# Patient Record
Sex: Male | Born: 2000
Health system: Southern US, Community
[De-identification: ages and names within clinical notes are randomized; demographics above are authoritative.]

## PROBLEM LIST (undated history)

## (undated) DIAGNOSIS — F902 Attention-deficit hyperactivity disorder, combined type: Secondary | ICD-10-CM

## (undated) DIAGNOSIS — F952 Tourette's disorder: Secondary | ICD-10-CM

## (undated) DIAGNOSIS — F988 Other specified behavioral and emotional disorders with onset usually occurring in childhood and adolescence: Secondary | ICD-10-CM

## (undated) DIAGNOSIS — F419 Anxiety disorder, unspecified: Secondary | ICD-10-CM

## (undated) DIAGNOSIS — R55 Syncope and collapse: Secondary | ICD-10-CM

## (undated) DIAGNOSIS — F411 Generalized anxiety disorder: Secondary | ICD-10-CM

## (undated) DIAGNOSIS — F32A Depression, unspecified: Secondary | ICD-10-CM

## (undated) DIAGNOSIS — G259 Extrapyramidal and movement disorder, unspecified: Secondary | ICD-10-CM

## (undated) DIAGNOSIS — F329 Major depressive disorder, single episode, unspecified: Secondary | ICD-10-CM

## (undated) HISTORY — DX: Depression, unspecified: F32.A

## (undated) HISTORY — DX: Attention-deficit hyperactivity disorder, combined type: F90.2

## (undated) HISTORY — DX: Syncope and collapse: R55

## (undated) HISTORY — DX: Major depressive disorder, single episode, unspecified: F32.9

## (undated) HISTORY — DX: Extrapyramidal and movement disorder, unspecified: G25.9

## (undated) HISTORY — DX: Generalized anxiety disorder: F41.1

## (undated) HISTORY — DX: Other specified behavioral and emotional disorders with onset usually occurring in childhood and adolescence: F98.8

## (undated) HISTORY — PX: CIRCUMCISION: SUR203

---
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
## 2022-06-21 IMAGING — CT CT ABD-PELV W/ CM
2 of 4 series · 16 of 46 positions shown, 18 images · IV contrast (omnipaque)
Comparison: Ultrasound 10/20/2006

CLINICAL DATA: Right lower quadrant pain

EXAM:
CT ABDOMEN AND PELVIS WITH CONTRAST
TECHNIQUE: Multidetector CT imaging of the abdomen and pelvis was performed
using the standard protocol following bolus administration of
intravenous contrast.
CONTRAST:  100mL OMNIPAQUE IOHEXOL 300 MG/ML  SOLN

[Series 2: axial st · axial · 0.68mm/px · z∈[-462,-42]mm · 13 of 96 slices shown, 15 images]
[im 6/96  soft-tissue]
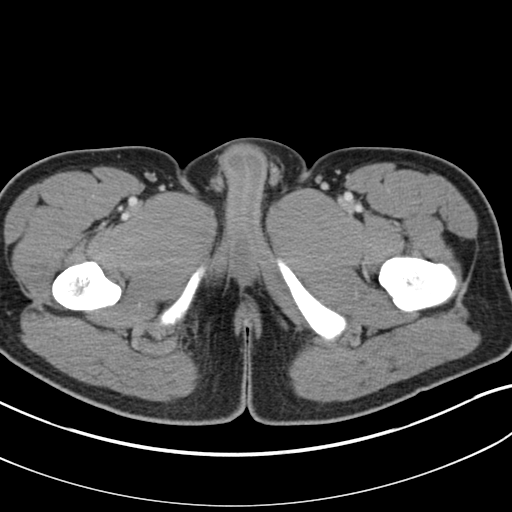
[im 6/96  bone]
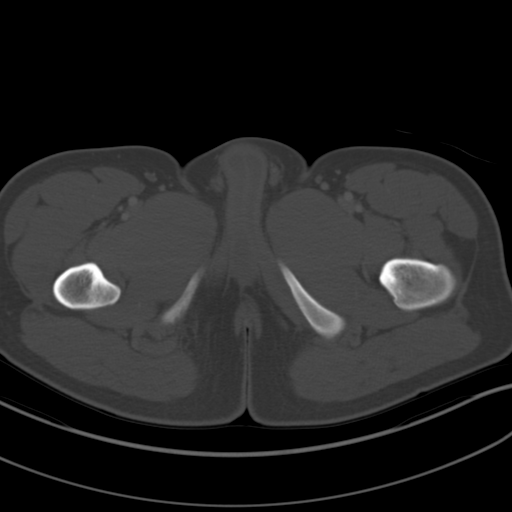
[im 11/96  soft-tissue]
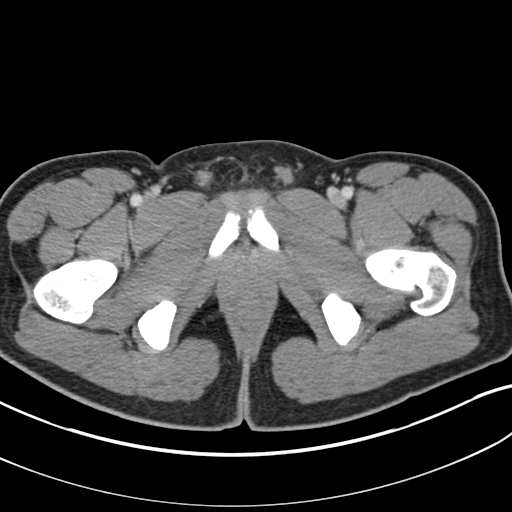
[im 22/96  soft-tissue]
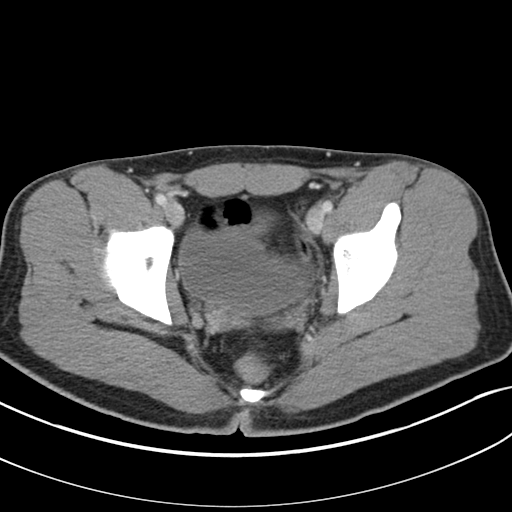
[im 27/96  soft-tissue]
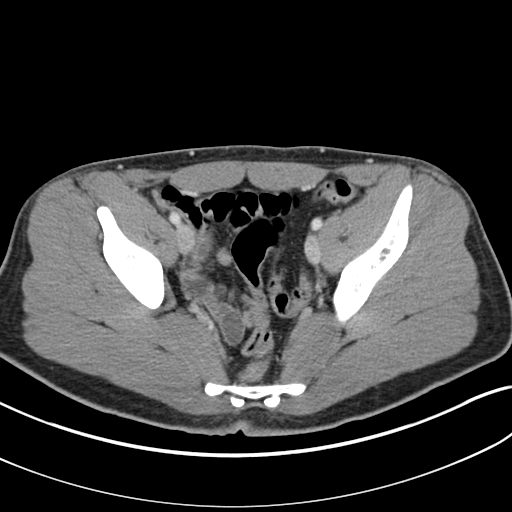
[im 32/96  soft-tissue]
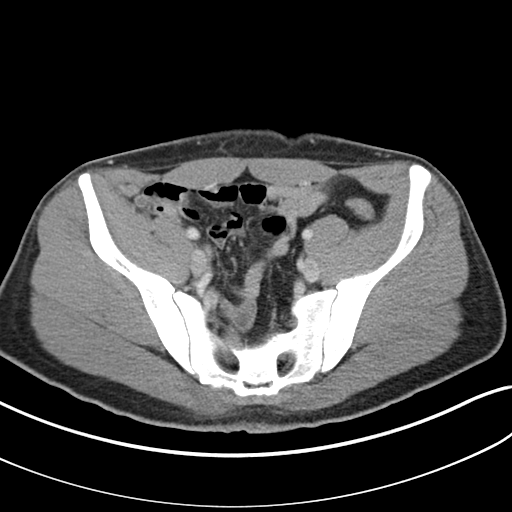
[im 43/96  soft-tissue]
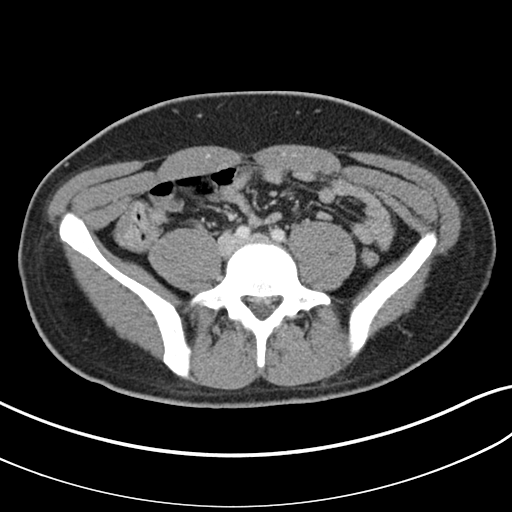
[im 48/96  soft-tissue]
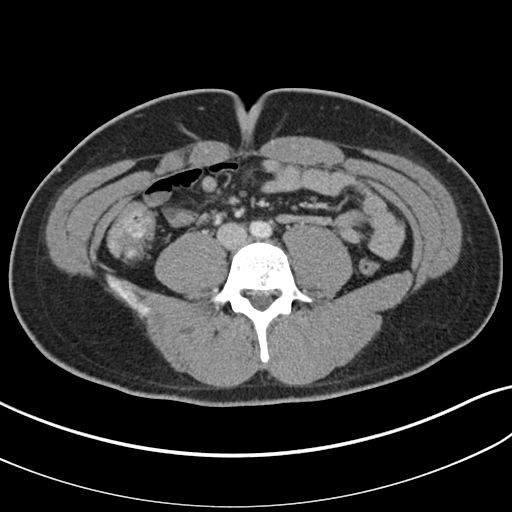
[im 53/96  soft-tissue]
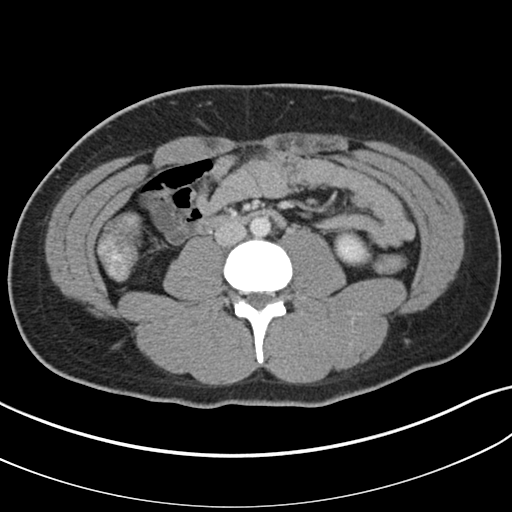
[im 64/96  soft-tissue]
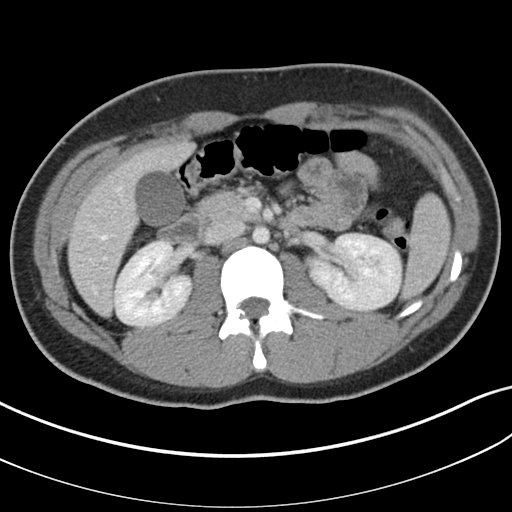
[im 64/96  bone]
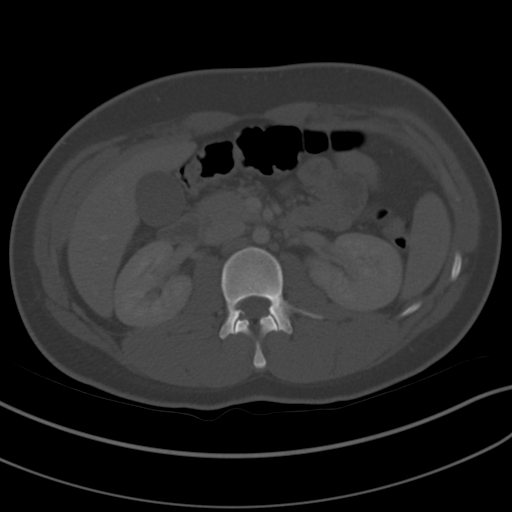
[im 69/96  soft-tissue]
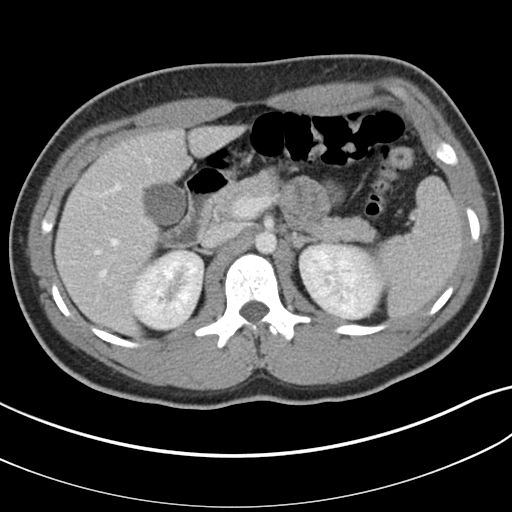
[im 74/96  soft-tissue]
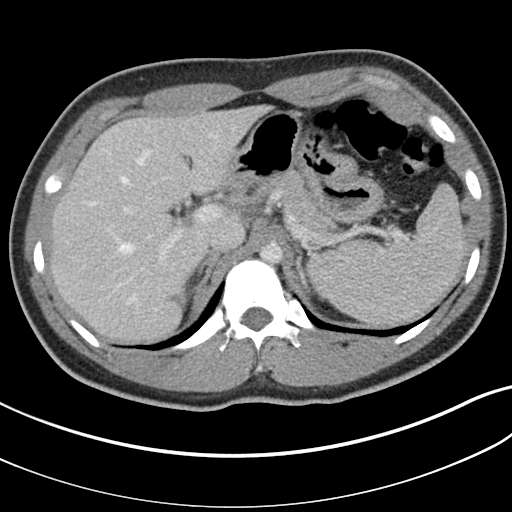
[im 85/96  soft-tissue]
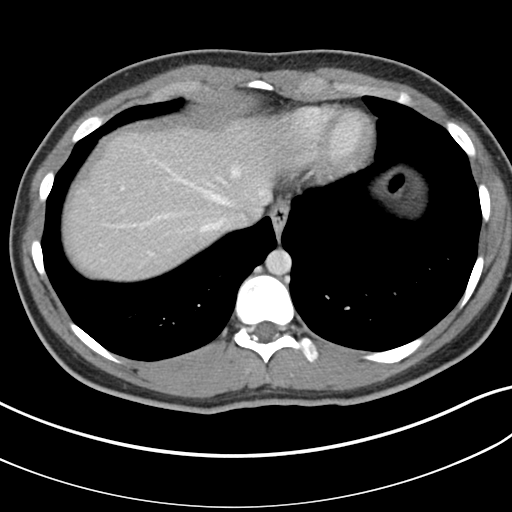
[im 90/96  soft-tissue]
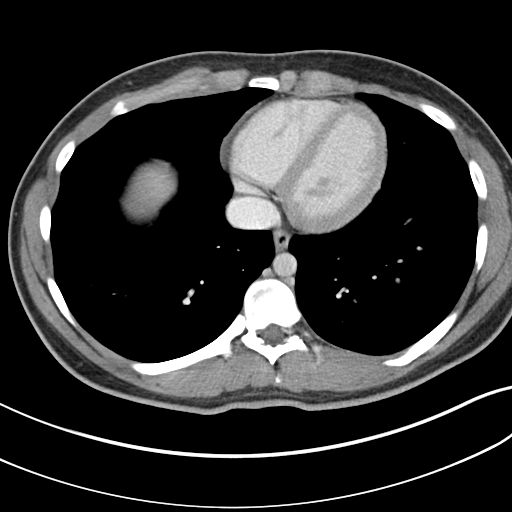

[Series 5: coronal st · coronal · 0.71mm/px · 3 of 140 slices shown]
[im 47/140  soft-tissue]
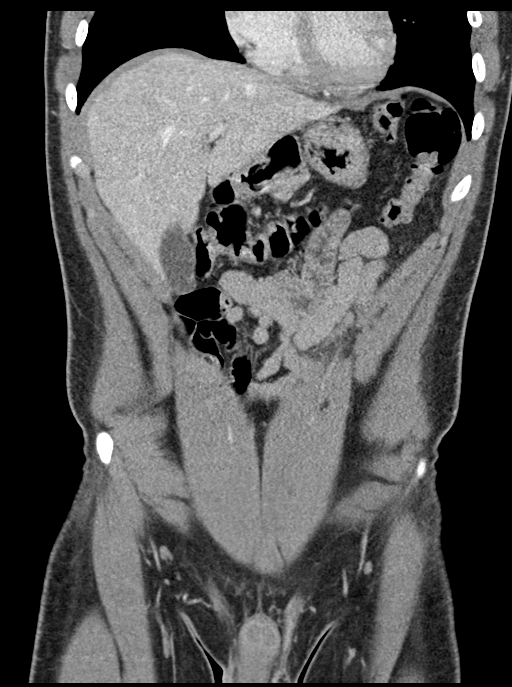
[im 62/140  soft-tissue]
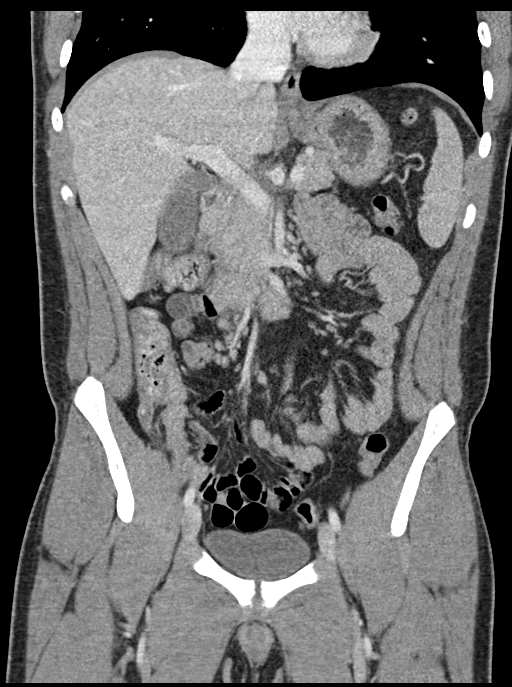
[im 78/140  soft-tissue]
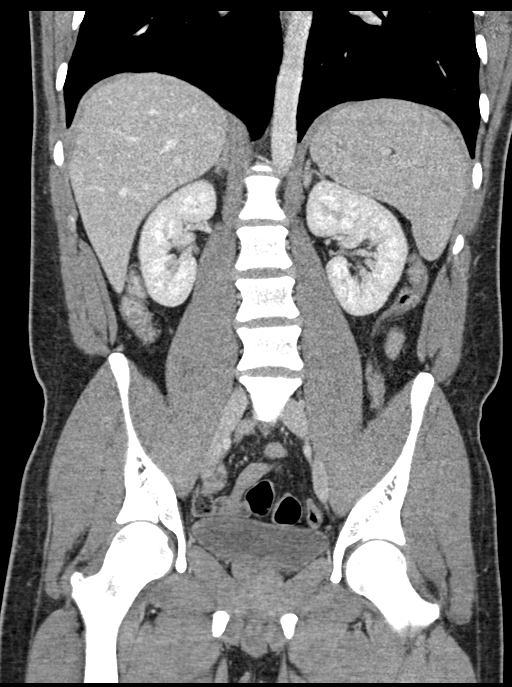

[16 of 46 positions shown; findings below may reference images not displayed]

FINDINGS: Lower chest: No acute abnormality.

Hepatobiliary: No focal liver abnormality is seen. No gallstones,
gallbladder wall thickening, or biliary dilatation.

Pancreas: Unremarkable. No pancreatic ductal dilatation or
surrounding inflammatory changes.

Spleen: Normal in size without focal abnormality.

Adrenals/Urinary Tract: Adrenal glands are unremarkable. Kidneys are
normal, without renal calculi, focal lesion, or hydronephrosis.
Bladder is unremarkable.

Stomach/Bowel: The stomach is nonenlarged. No dilated small bowel.
Abnormal appendix. Appendix is enlarged, measuring up to 9 mm. Mild
periappendiceal soft tissue stranding with punctate appendicoliths
in the proximal lumen.

Vascular/Lymphatic: No significant vascular findings are present. No
enlarged abdominal or pelvic lymph nodes.

Reproductive: Prostate is unremarkable.

Other: No abdominal wall hernia or abnormality. No abdominopelvic
ascites.

Musculoskeletal: No acute or significant osseous findings.
IMPRESSION: Findings consistent with acute non perforated appendicitis.

Appendix: Location: Right lower quadrant

Diameter: 9 mm

Appendicolith: Positive

Mucosal hyperenhancement: Positive

Extraluminal gas: Negative

Periappendical collection: Negative

## 2022-10-30 ENCOUNTER — Encounter: Payer: Self-pay | Admitting: Adult Health
